# Patient Record
Sex: Female | Born: 1975 | Race: Black or African American | Hispanic: No | Marital: Single | Smoking: Never smoker
Health system: Southern US, Community
[De-identification: ages and names within clinical notes are randomized; demographics above are authoritative.]

## PROBLEM LIST (undated history)

## (undated) DIAGNOSIS — D649 Anemia, unspecified: Secondary | ICD-10-CM

## (undated) DIAGNOSIS — I10 Essential (primary) hypertension: Secondary | ICD-10-CM

---

## 1997-11-17 ENCOUNTER — Encounter: Admission: RE | Admit: 1997-11-17 | Discharge: 1997-11-17 | Payer: Self-pay | Admitting: Family Medicine

## 1997-12-18 ENCOUNTER — Encounter: Admission: RE | Admit: 1997-12-18 | Discharge: 1997-12-18 | Payer: Self-pay | Admitting: Family Medicine

## 1998-01-06 ENCOUNTER — Encounter: Admission: RE | Admit: 1998-01-06 | Discharge: 1998-01-06 | Payer: Self-pay | Admitting: Family Medicine

## 1998-06-10 ENCOUNTER — Encounter: Admission: RE | Admit: 1998-06-10 | Discharge: 1998-06-10 | Payer: Self-pay | Admitting: Family Medicine

## 1998-08-20 ENCOUNTER — Encounter: Admission: RE | Admit: 1998-08-20 | Discharge: 1998-08-20 | Payer: Self-pay | Admitting: Family Medicine

## 1998-08-26 ENCOUNTER — Encounter: Admission: RE | Admit: 1998-08-26 | Discharge: 1998-08-26 | Payer: Self-pay | Admitting: Family Medicine

## 1998-09-11 ENCOUNTER — Other Ambulatory Visit: Admission: RE | Admit: 1998-09-11 | Discharge: 1998-09-11 | Payer: Self-pay | Admitting: Obstetrics & Gynecology

## 1999-01-22 ENCOUNTER — Inpatient Hospital Stay (HOSPITAL_COMMUNITY): Admission: AD | Admit: 1999-01-22 | Discharge: 1999-01-22 | Payer: Self-pay | Admitting: Obstetrics & Gynecology

## 1999-02-26 ENCOUNTER — Inpatient Hospital Stay (HOSPITAL_COMMUNITY): Admission: AD | Admit: 1999-02-26 | Discharge: 1999-02-28 | Payer: Self-pay | Admitting: *Deleted

## 1999-03-02 ENCOUNTER — Encounter (HOSPITAL_COMMUNITY): Admission: RE | Admit: 1999-03-02 | Discharge: 1999-05-31 | Payer: Self-pay | Admitting: *Deleted

## 1999-04-13 ENCOUNTER — Other Ambulatory Visit: Admission: RE | Admit: 1999-04-13 | Discharge: 1999-04-13 | Payer: Self-pay | Admitting: Obstetrics & Gynecology

## 1999-07-06 ENCOUNTER — Other Ambulatory Visit: Admission: RE | Admit: 1999-07-06 | Discharge: 1999-07-06 | Payer: Self-pay | Admitting: Obstetrics & Gynecology

## 2000-04-17 ENCOUNTER — Emergency Department (HOSPITAL_COMMUNITY): Admission: EM | Admit: 2000-04-17 | Discharge: 2000-04-17 | Payer: Self-pay | Admitting: Emergency Medicine

## 2000-04-17 ENCOUNTER — Encounter: Payer: Self-pay | Admitting: Emergency Medicine

## 2000-10-17 ENCOUNTER — Other Ambulatory Visit: Admission: RE | Admit: 2000-10-17 | Discharge: 2000-10-17 | Payer: Self-pay | Admitting: Obstetrics & Gynecology

## 2000-10-25 ENCOUNTER — Emergency Department (HOSPITAL_COMMUNITY): Admission: EM | Admit: 2000-10-25 | Discharge: 2000-10-25 | Payer: Self-pay | Admitting: Emergency Medicine

## 2000-10-25 ENCOUNTER — Encounter: Payer: Self-pay | Admitting: Emergency Medicine

## 2000-11-17 ENCOUNTER — Ambulatory Visit (HOSPITAL_COMMUNITY): Admission: RE | Admit: 2000-11-17 | Discharge: 2000-11-17 | Payer: Self-pay | Admitting: Obstetrics & Gynecology

## 2000-11-17 ENCOUNTER — Encounter: Payer: Self-pay | Admitting: Obstetrics & Gynecology

## 2001-08-14 ENCOUNTER — Other Ambulatory Visit: Admission: RE | Admit: 2001-08-14 | Discharge: 2001-08-14 | Payer: Self-pay | Admitting: Obstetrics & Gynecology

## 2017-05-27 ENCOUNTER — Emergency Department (HOSPITAL_COMMUNITY): Payer: PRIVATE HEALTH INSURANCE

## 2017-05-27 ENCOUNTER — Emergency Department (HOSPITAL_COMMUNITY)
Admission: EM | Admit: 2017-05-27 | Discharge: 2017-05-27 | Disposition: A | Discharge status: Home / Self Care | Payer: PRIVATE HEALTH INSURANCE | Attending: Emergency Medicine | Admitting: Emergency Medicine

## 2017-05-27 ENCOUNTER — Encounter (HOSPITAL_COMMUNITY): Payer: Self-pay | Admitting: Emergency Medicine

## 2017-05-27 ENCOUNTER — Other Ambulatory Visit: Payer: Self-pay

## 2017-05-27 DIAGNOSIS — R0789 Other chest pain: Secondary | ICD-10-CM | POA: Diagnosis not present

## 2017-05-27 DIAGNOSIS — I10 Essential (primary) hypertension: Secondary | ICD-10-CM | POA: Insufficient documentation

## 2017-05-27 DIAGNOSIS — R51 Headache: Secondary | ICD-10-CM | POA: Diagnosis not present

## 2017-05-27 DIAGNOSIS — R079 Chest pain, unspecified: Secondary | ICD-10-CM | POA: Diagnosis present

## 2017-05-27 HISTORY — DX: Anemia, unspecified: D64.9

## 2017-05-27 HISTORY — DX: Essential (primary) hypertension: I10

## 2017-05-27 LAB — BASIC METABOLIC PANEL
Anion gap: 8 (ref 5–15)
CO2: 28 mmol/L (ref 22–32)
CREATININE: 1.11 mg/dL — AB (ref 0.44–1.00)
Calcium: 8.4 mg/dL — ABNORMAL LOW (ref 8.9–10.3)
Chloride: 99 mmol/L — ABNORMAL LOW (ref 101–111)
Glucose, Bld: 156 mg/dL — ABNORMAL HIGH (ref 65–99)
POTASSIUM: 3.2 mmol/L — AB (ref 3.5–5.1)
SODIUM: 135 mmol/L (ref 135–145)

## 2017-05-27 LAB — CBC
HEMATOCRIT: 32 % — AB (ref 36.0–46.0)
Hemoglobin: 10.4 g/dL — ABNORMAL LOW (ref 12.0–15.0)
MCH: 27.7 pg (ref 26.0–34.0)
MCHC: 32.5 g/dL (ref 30.0–36.0)
MCV: 85.3 fL (ref 78.0–100.0)
PLATELETS: 466 10*3/uL — AB (ref 150–400)
RBC: 3.75 MIL/uL — AB (ref 3.87–5.11)
RDW: 13.9 % (ref 11.5–15.5)
WBC: 4.6 10*3/uL (ref 4.0–10.5)

## 2017-05-27 LAB — I-STAT BETA HCG BLOOD, ED (MC, WL, AP ONLY)

## 2017-05-27 LAB — I-STAT TROPONIN, ED: Troponin i, poc: 0 ng/mL (ref 0.00–0.08)

## 2017-05-27 MED ORDER — FAMOTIDINE 20 MG PO TABS: 20.0000 mg | ORAL_TABLET | Freq: Two times a day (BID) | ORAL | 0 refills | Status: AC

## 2017-05-27 MED ORDER — GI COCKTAIL ~~LOC~~
30.0000 mL | Freq: Once | ORAL | Status: AC
  Administered 2017-05-27: 30 mL via ORAL
  Filled 2017-05-27: qty 30

## 2017-05-27 NOTE — ED Provider Notes (Signed)
[facility]   CSN: [id number] Arrival date & time: 05/27/17  1113     History   Chief Complaint Chief Complaint  Patient presents with  . Chest Pain  . Headache    HPI Erin Burnett is a 41 y.o. female.  Pt presents to the ED today with CP that has been going on for over a week.  She experiences it mainly in the morning.  She had a h/a last night, but no pain now.  The pt denies sob or n/v.      Past Medical History:  Diagnosis Date  . Anemia   . Hypertension     There are no active problems to display for this patient.   History reviewed. No pertinent surgical history.  OB History    No data available       Home Medications    Prior to Admission medications   Medication Sig Start Date End Date Taking? Authorizing Provider  famotidine (PEPCID) 20 MG tablet Take 1 tablet (20 mg total) by mouth 2 (two) times daily. 05/27/17   Jacalyn LefevreHaviland, Chyna Kneece, MD    Family History No family history on file.  Social History Social History   Tobacco Use  . Smoking status: Never Smoker  . Smokeless tobacco: Never Used  Substance Use Topics  . Alcohol use: No    Frequency: Never  . Drug use: No     Allergies   Patient has no allergy information on record.   Review of Systems Review of Systems  Cardiovascular: Positive for chest pain.  All other systems reviewed and are negative.    Physical Exam Updated Vital Signs BP (!) 149/73   Pulse 73   Temp 98.2 F (36.8 C) (Oral)   Resp (!) 23   Ht 6' (1.829 m)   Wt (!) 138.3 kg (305 lb)   LMP 04/20/2017   SpO2 97%   BMI 41.37 kg/m   Physical Exam  Constitutional: She is oriented to person, place, and time. She appears well-developed and well-nourished.  HENT:  Head: Normocephalic and atraumatic.  Eyes: EOM are normal. Pupils are equal, round, and reactive to light.  Neck: Normal range of motion. Neck supple.  Cardiovascular: Normal rate and regular  rhythm.  Pulmonary/Chest: Effort normal and breath sounds normal.  Abdominal: Soft. Bowel sounds are normal.  Musculoskeletal: Normal range of motion.       Right lower leg: Normal.       Left lower leg: Normal.  Neurological: She is alert and oriented to person, place, and time.  Skin: Skin is warm. Capillary refill takes less than 2 seconds.  Psychiatric: She has a normal mood and affect. Her behavior is normal.  Nursing note and vitals reviewed.    ED Treatments / Results  Labs (all labs ordered are listed, but only abnormal results are displayed) Labs Reviewed  BASIC METABOLIC PANEL - Abnormal; Notable for the following components:      Result Value   Potassium 3.2 (*)    Chloride 99 (*)    Glucose, Bld 156 (*)    BUN <5 (*)    Creatinine, Ser 1.11 (*)    Calcium 8.4 (*)    All other components within normal limits  CBC - Abnormal; Notable for the following components:   RBC 3.75 (*)    Hemoglobin 10.4 (*)    HCT 32.0 (*)    Platelets 466 (*)    All other components within  normal limits  I-STAT TROPONIN, ED  I-STAT BETA HCG BLOOD, ED ([facility], [facility], [facility] ONLY)    EKG  EKG Interpretation  Date/Time:  Saturday May 27 2017 13:29:19 EST Ventricular Rate:  83 PR Interval:    QRS Duration: 94 QT Interval:  360 QTC Calculation: 423 R Axis:   71 Text Interpretation:  Sinus rhythm Consider left atrial enlargement Borderline repolarization abnormality Confirmed by Jacalyn LefevreHaviland, Chosen Geske [phone]([id number]) on 05/27/2017 1:32:43 PM       Radiology Dg Chest 2 View  Result Date: 05/27/2017 CLINICAL DATA:  Chest pain. EXAM: CHEST  2 VIEW COMPARISON:  None. FINDINGS: The heart size and mediastinal contours are within normal limits. Both lungs are clear. No pneumothorax or pleural effusion is noted. The visualized skeletal structures are unremarkable. IMPRESSION: No active cardiopulmonary disease. Electronically Signed   By: Lupita RaiderJames  Green Jr, M.D.   On: 05/27/2017 12:07     Procedures Procedures (including critical care time)  Medications Ordered in ED Medications  gi cocktail (Maalox,Lidocaine,Donnatal) (30 mLs Oral Given 05/27/17 1351)     Initial Impression / Assessment and Plan / ED Course  I have reviewed the triage vital signs and the nursing notes.  Pertinent labs & imaging results that were available during my care of the patient were reviewed by me and considered in my medical decision making (see chart for details).    Pt's sx don't sound cardiac.  She is low risk for CAD as well.  Sx improved with GI cocktail, so she will be d/c home on pepcid.  She knows to return if worse.  Final Clinical Impressions(s) / ED Diagnoses   Final diagnoses:  Atypical chest pain    ED Discharge Orders        Ordered    famotidine (PEPCID) 20 MG tablet  2 times daily     05/27/17 1425       Jacalyn LefevreHaviland, Ignacio Lowder, MD 05/27/17 1428

## 2017-05-27 NOTE — ED Notes (Signed)
Patient able to ambulate independently  

## 2017-05-27 NOTE — ED Notes (Signed)
Patient states she is experiencing significant relief with the GI Cocktail

## 2017-05-27 NOTE — ED Triage Notes (Signed)
Pt. Stated, Ive had chest pain and head pain for a week.

## 2019-04-22 IMAGING — DX DG CHEST 2V
2 series · 2 of 2 positions shown · non-contrast
Comparison: None.

CLINICAL DATA: Chest pain.

EXAM:
CHEST  2 VIEW

[w chest pa]
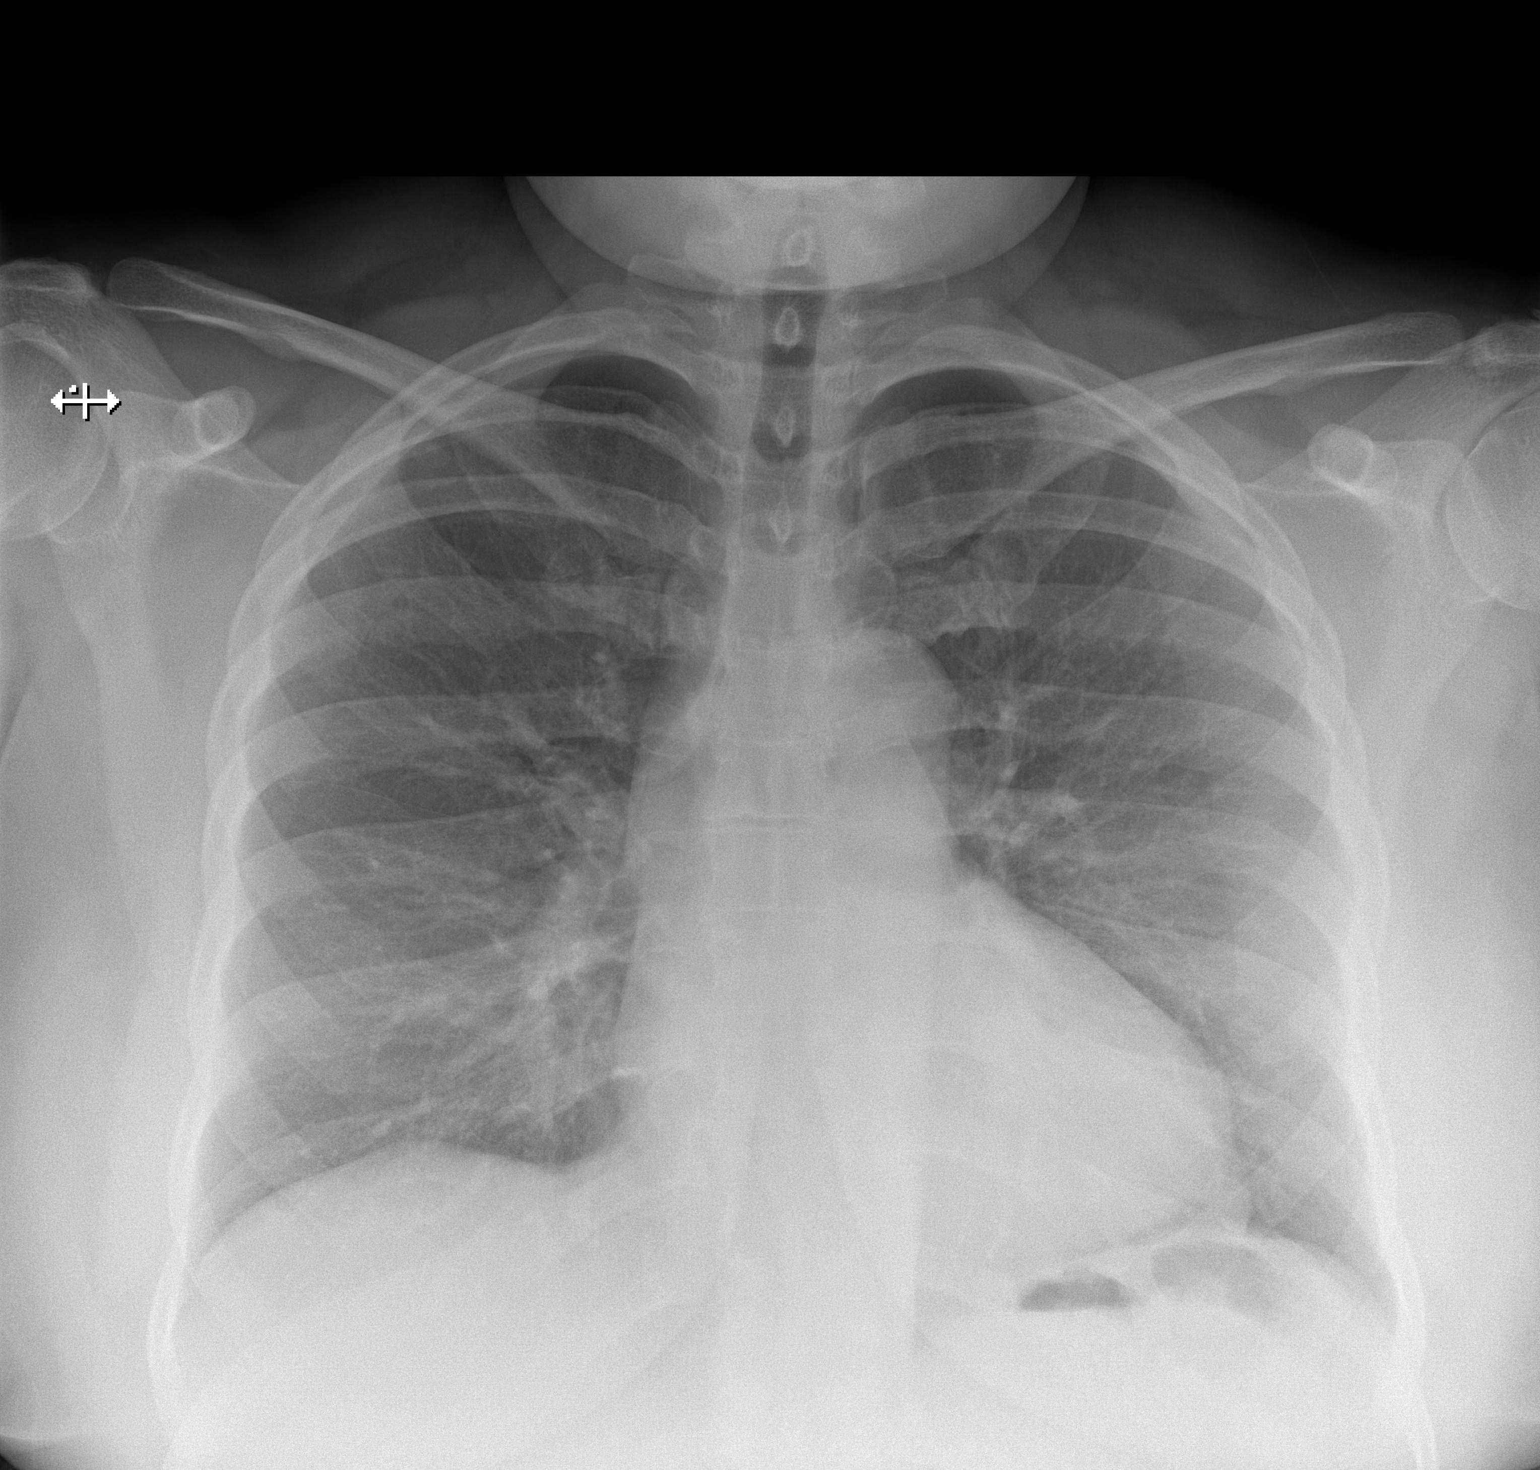

[w chest lat]
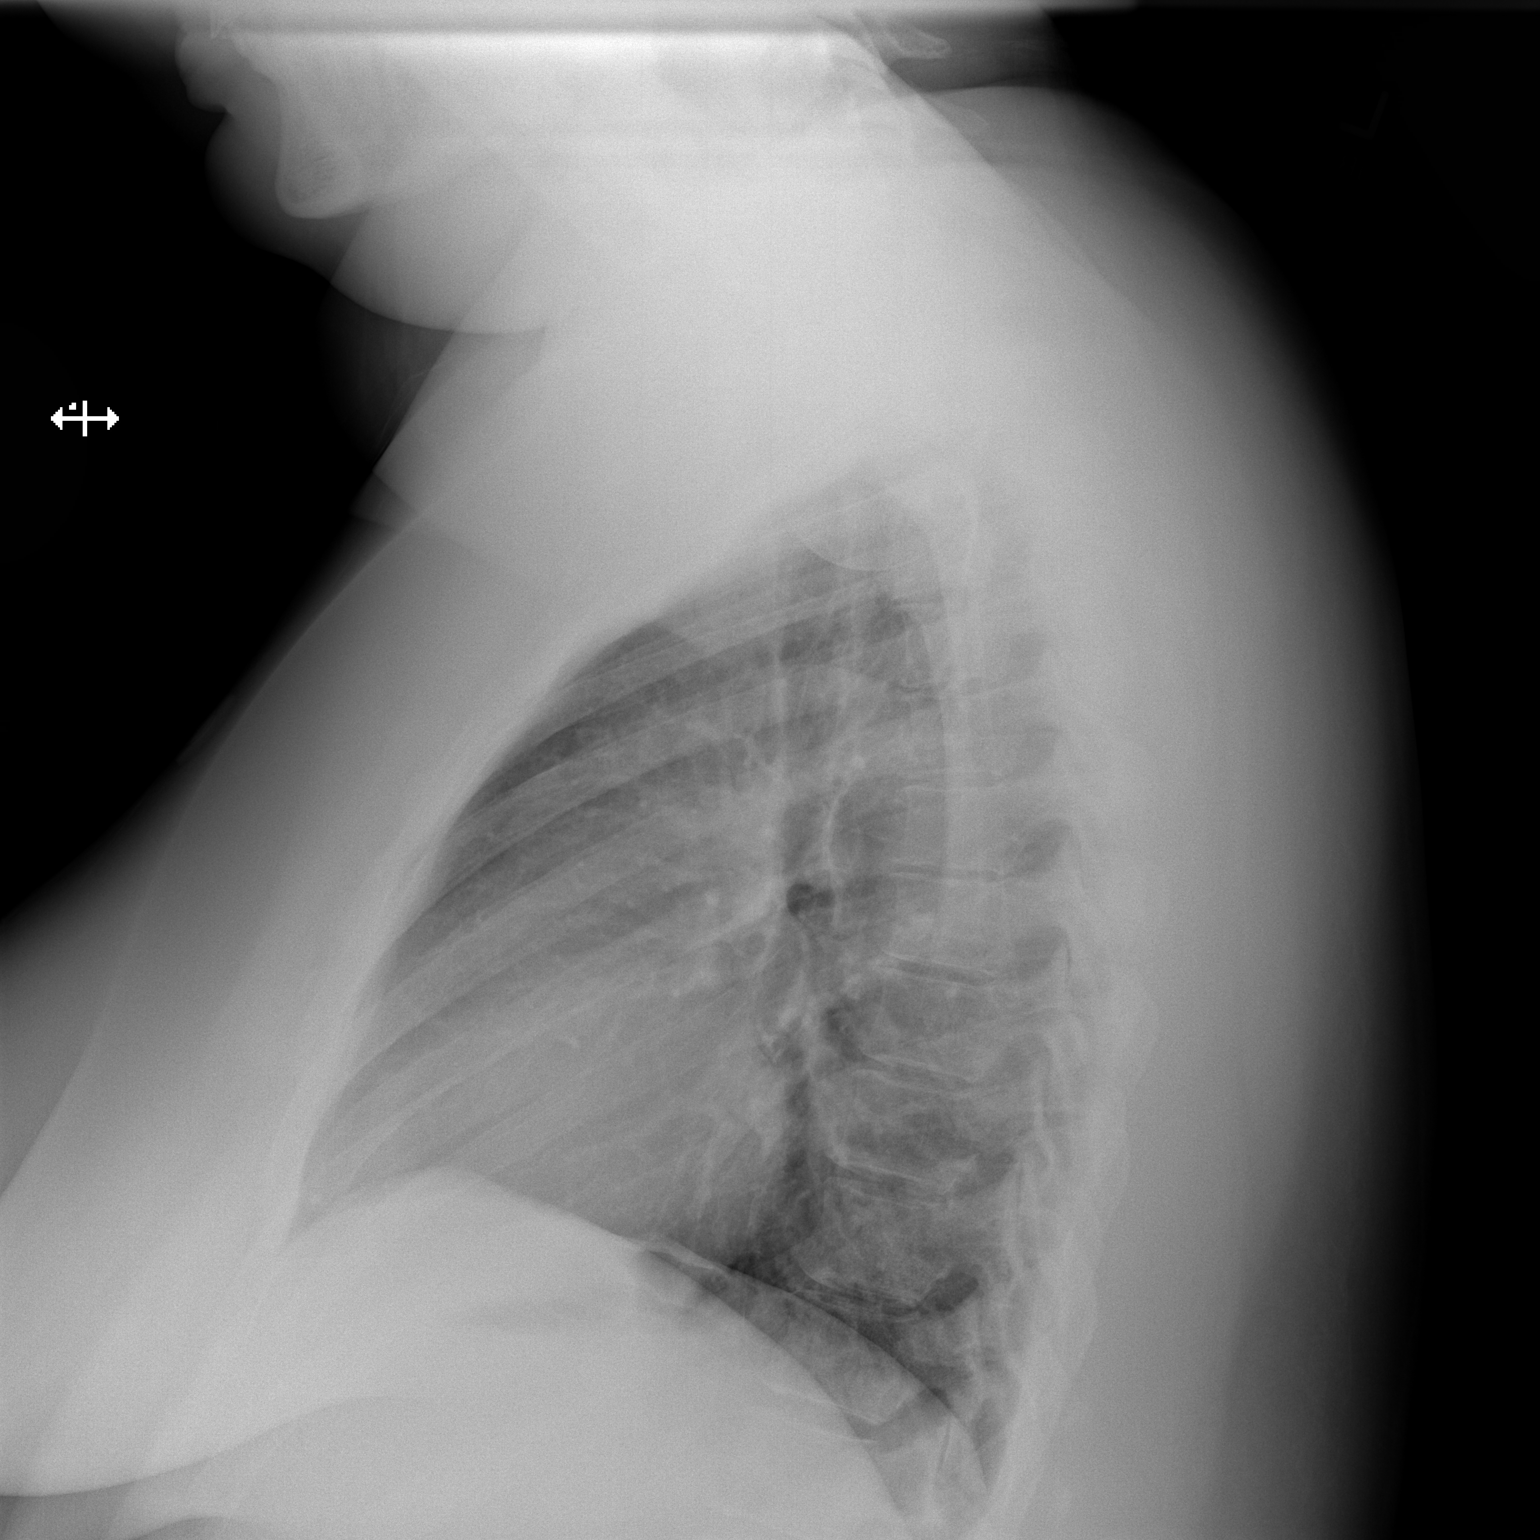

[2 of 2 positions shown; findings below may reference images not displayed]

FINDINGS: The heart size and mediastinal contours are within normal limits.
Both lungs are clear. No pneumothorax or pleural effusion is noted.
The visualized skeletal structures are unremarkable.
IMPRESSION: No active cardiopulmonary disease.
